# Patient Record
Sex: Female | Born: 1960
Health system: Southern US, Community
[De-identification: ages and names within clinical notes are randomized; demographics above are authoritative.]

## PROBLEM LIST (undated history)

## (undated) DIAGNOSIS — Z91018 Allergy to other foods: Secondary | ICD-10-CM

## (undated) DIAGNOSIS — K219 Gastro-esophageal reflux disease without esophagitis: Secondary | ICD-10-CM

## (undated) HISTORY — DX: Gastro-esophageal reflux disease without esophagitis: K21.9

## (undated) HISTORY — DX: Allergy to other foods: Z91.018

## (undated) HISTORY — PX: DILATION AND CURETTAGE OF UTERUS: SHX78

---

## 1999-05-28 ENCOUNTER — Other Ambulatory Visit: Admission: RE | Admit: 1999-05-28 | Discharge: 1999-05-28 | Payer: Self-pay | Admitting: *Deleted

## 1999-06-07 ENCOUNTER — Ambulatory Visit (HOSPITAL_COMMUNITY): Admission: RE | Admit: 1999-06-07 | Discharge: 1999-06-07 | Payer: Self-pay | Admitting: *Deleted

## 2000-04-29 ENCOUNTER — Other Ambulatory Visit: Admission: RE | Admit: 2000-04-29 | Discharge: 2000-04-29 | Payer: Self-pay | Admitting: *Deleted

## 2000-05-03 ENCOUNTER — Ambulatory Visit (HOSPITAL_COMMUNITY): Admission: RE | Admit: 2000-05-03 | Discharge: 2000-05-03 | Payer: Self-pay | Admitting: Obstetrics & Gynecology

## 2000-05-03 ENCOUNTER — Encounter: Payer: Self-pay | Admitting: Obstetrics & Gynecology

## 2000-05-05 ENCOUNTER — Ambulatory Visit: Admission: AD | Admit: 2000-05-05 | Discharge: 2000-05-05 | Payer: Self-pay | Admitting: Obstetrics and Gynecology

## 2000-05-05 ENCOUNTER — Encounter (INDEPENDENT_AMBULATORY_CARE_PROVIDER_SITE_OTHER): Payer: Self-pay | Admitting: Specialist

## 2000-12-05 ENCOUNTER — Encounter: Payer: Self-pay | Admitting: Internal Medicine

## 2000-12-05 ENCOUNTER — Ambulatory Visit (HOSPITAL_COMMUNITY): Admission: RE | Admit: 2000-12-05 | Discharge: 2000-12-05 | Payer: Self-pay | Admitting: Internal Medicine

## 2000-12-26 ENCOUNTER — Encounter: Payer: Self-pay | Admitting: Obstetrics and Gynecology

## 2000-12-26 ENCOUNTER — Encounter: Admission: RE | Admit: 2000-12-26 | Discharge: 2000-12-26 | Payer: Self-pay | Admitting: Obstetrics and Gynecology

## 2001-02-25 ENCOUNTER — Encounter: Admission: RE | Admit: 2001-02-25 | Discharge: 2001-02-25 | Payer: Self-pay | Admitting: Obstetrics and Gynecology

## 2001-02-25 ENCOUNTER — Encounter: Payer: Self-pay | Admitting: Obstetrics and Gynecology

## 2001-04-01 ENCOUNTER — Other Ambulatory Visit: Admission: RE | Admit: 2001-04-01 | Discharge: 2001-04-01 | Payer: Self-pay | Admitting: Obstetrics and Gynecology

## 2001-05-13 ENCOUNTER — Encounter (INDEPENDENT_AMBULATORY_CARE_PROVIDER_SITE_OTHER): Payer: Self-pay | Admitting: Specialist

## 2001-05-13 ENCOUNTER — Ambulatory Visit (HOSPITAL_COMMUNITY): Admission: RE | Admit: 2001-05-13 | Discharge: 2001-05-13 | Payer: Self-pay | Admitting: Obstetrics and Gynecology

## 2001-09-23 ENCOUNTER — Encounter: Payer: Self-pay | Admitting: Internal Medicine

## 2001-09-23 ENCOUNTER — Encounter: Admission: RE | Admit: 2001-09-23 | Discharge: 2001-09-23 | Payer: Self-pay | Admitting: Internal Medicine

## 2001-10-02 ENCOUNTER — Encounter: Payer: Self-pay | Admitting: Obstetrics and Gynecology

## 2001-10-02 ENCOUNTER — Ambulatory Visit (HOSPITAL_COMMUNITY): Admission: RE | Admit: 2001-10-02 | Discharge: 2001-10-02 | Payer: Self-pay | Admitting: Obstetrics and Gynecology

## 2002-05-26 ENCOUNTER — Other Ambulatory Visit: Admission: RE | Admit: 2002-05-26 | Discharge: 2002-05-26 | Payer: Self-pay | Admitting: Obstetrics and Gynecology

## 2002-05-28 ENCOUNTER — Encounter: Payer: Self-pay | Admitting: Obstetrics and Gynecology

## 2002-05-28 ENCOUNTER — Encounter: Admission: RE | Admit: 2002-05-28 | Discharge: 2002-05-28 | Payer: Self-pay | Admitting: Obstetrics and Gynecology

## 2003-05-30 ENCOUNTER — Other Ambulatory Visit: Admission: RE | Admit: 2003-05-30 | Discharge: 2003-05-30 | Payer: Self-pay | Admitting: Obstetrics and Gynecology

## 2003-06-20 ENCOUNTER — Encounter: Admission: RE | Admit: 2003-06-20 | Discharge: 2003-06-20 | Payer: Self-pay | Admitting: Neurology

## 2003-06-20 ENCOUNTER — Encounter: Payer: Self-pay | Admitting: Neurology

## 2004-05-29 ENCOUNTER — Encounter: Admission: RE | Admit: 2004-05-29 | Discharge: 2004-05-29 | Payer: Self-pay | Admitting: Obstetrics and Gynecology

## 2005-06-03 ENCOUNTER — Encounter: Admission: RE | Admit: 2005-06-03 | Discharge: 2005-06-03 | Payer: Self-pay | Admitting: Obstetrics and Gynecology

## 2006-03-12 ENCOUNTER — Encounter: Payer: Self-pay | Admitting: *Deleted

## 2007-01-06 ENCOUNTER — Encounter: Admission: RE | Admit: 2007-01-06 | Discharge: 2007-01-06 | Payer: Self-pay | Admitting: Internal Medicine

## 2007-06-08 ENCOUNTER — Encounter: Admission: RE | Admit: 2007-06-08 | Discharge: 2007-06-08 | Payer: Self-pay | Admitting: Obstetrics and Gynecology

## 2007-06-11 ENCOUNTER — Encounter: Admission: RE | Admit: 2007-06-11 | Discharge: 2007-06-11 | Payer: Self-pay | Admitting: Obstetrics and Gynecology

## 2008-06-16 ENCOUNTER — Encounter: Admission: RE | Admit: 2008-06-16 | Discharge: 2008-06-16 | Payer: Self-pay | Admitting: Obstetrics and Gynecology

## 2010-06-15 ENCOUNTER — Encounter: Admission: RE | Admit: 2010-06-15 | Discharge: 2010-06-15 | Payer: Self-pay | Admitting: Obstetrics and Gynecology

## 2010-12-02 ENCOUNTER — Encounter: Payer: Self-pay | Admitting: Obstetrics and Gynecology

## 2011-03-29 NOTE — Op Note (Signed)
[facility]  Patient:    Tammy Whitney, Tammy Whitney                       MRN: [id number] Proc. Date: 05/05/00 Attending:  Janine Limbo, M.D.                           Operative Report  PREOPERATIVE DIAGNOSIS:       Missed abortion.  POSTOPERATIVE DIAGNOSIS:      Missed abortion.  OPERATION:                    Suction dilatation and evacuation.  SURGEON:                      Janine Limbo, M.D.  ASSISTANT:  ANESTHESIA:                   IV sedation and paracervical block using 1% Xylocaine.  ESTIMATED BLOOD LOSS:  INDICATIONS:                  [name] is a 50 year old female who presents ith vaginal bleeding and slight cramping.  Her quantitative hCG on April 29, 2000, was 3694.  Repeat quantitative hCG on May 01, 2000, was 3300.  The patient understands the indications for her procedure.  She accepts the risks of, but not limited to, anesthetic complications, bleeding, infections, and possible damage to the surrounding organs.  FINDINGS:                     The patients blood type is O positive.  A small amount of products of conception were removed from within a 6 to 8 weeks size uterus.  No adnexal masses were appreciated.  DESCRIPTION OF PROCEDURE:     The patient was taken to the operating room where she was given medication through her IV line.  The perineum was prepped with multiple layers of Betadine.  The bladder was drained of urine.  Examination under anesthesia was performed.  The vagina was prepped with multiple layers of Betadine. A paracervical block was placed using 10 cc of 1% Xylocaine without epinephrine. The uterus sounded to approximately 10 cm.  The cervix was gradually dilated. he uterine cavity was evacuated using a #8 suction curet and then a medium sharp curet.  The cavity was felt to be clean at the end of our procedure. Hemostasis was adequate.  All instruments were removed.  Examination was repeated  and the uterus was noted to be firm.  The patient was then taken to the recovery room in stable condition.  FOLLOW-UP:                    The patient was given a copy of the postoperative  instruction sheet as prepared by [facility] for patients who  have undergone a dilatation and curettage.  She was given a prescription for Vicodin one to two p.o. q.4h. p.r.n. pain.  She will return to see Janine Limbo, M.D. in two to three weeks for follow-up examination.  She will call or questions or concerns. DD:  05/05/00 TD:  05/05/00 Job: [id number]

## 2011-03-29 NOTE — Op Note (Signed)
[facility]  Patient:    Tammy Whitney, Tammy Whitney                       MRN: [id number] Proc. Date: 05/13/01 Adm. Date:  [id number] Attending:  Dierdre Forth Pearline                           Operative Report  PREOPERATIVE DIAGNOSIS:       Missed abortion, intrauterine fetal demise at                               11 weeks.  POSTOPERATIVE DIAGNOSIS:      Missed abortion, intrauterine fetal demise at                               11 weeks.  OPERATION:                    Suction dilatation and evacuation.  SURGEON:                      Vanessa P. Pennie Rushing, M.D.  ANESTHESIA:                   Monitored anesthesia care and local.  ESTIMATED BLOOD LOSS:         Less than 50 cc.  COMPLICATIONS:                None.  FINDINGS:                     The uterus was enlarged to approximately 10-week size. There was a moderate amount of products of conception in the uterine cavity.  DESCRIPTION OF PROCEDURE:     The patient was taken to the operating room after appropriate identification and placed on the operating table. After attaining equipment for monitored anesthesia care, she was placed in the lithotomy position. The perineum and vagina were prepped with multiple layers of Betadine and draped as a sterile field. A red Robinson catheter was used to empty the bladder. A Graves speculum was placed in the vagina and a single-tooth tenaculum placed on the anterior cervix. A paracervical block was achieved with a total of 10 cc of 2% Xylocaine in the 5 and 7 oclock positions. The cervix was then dilated to accommodate a #10 suction catheter and this was used too suction evacuate all contents from the uterus. A sharp curet was used to insure that all products of conception had been removed. Hemostasis was noted to be adequate. All instruments were removed from the vagina. Silver nitrate was used to cauterize the area where the single-tooth tenaculum had been. The  patient was taken from the operating room to the recovery room in satisfactory condition having tolerated the procedure well with sponge and instrument counts correct. DD:  05/13/01 TD:  05/13/01 Job: [id number]
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
## 2011-05-22 ENCOUNTER — Other Ambulatory Visit: Payer: Self-pay | Admitting: Obstetrics and Gynecology

## 2011-05-22 DIAGNOSIS — Z1231 Encounter for screening mammogram for malignant neoplasm of breast: Secondary | ICD-10-CM

## 2011-06-18 ENCOUNTER — Ambulatory Visit
Admission: RE | Admit: 2011-06-18 | Discharge: 2011-06-18 | Disposition: A | Discharge status: Home / Self Care | Payer: BC Managed Care – PPO | Source: Ambulatory Visit | Attending: Obstetrics and Gynecology | Admitting: Obstetrics and Gynecology

## 2011-06-18 DIAGNOSIS — Z1231 Encounter for screening mammogram for malignant neoplasm of breast: Secondary | ICD-10-CM

## 2012-01-01 DIAGNOSIS — R490 Dysphonia: Secondary | ICD-10-CM | POA: Insufficient documentation

## 2012-05-22 ENCOUNTER — Other Ambulatory Visit: Payer: Self-pay | Admitting: Obstetrics and Gynecology

## 2012-05-22 DIAGNOSIS — Z1231 Encounter for screening mammogram for malignant neoplasm of breast: Secondary | ICD-10-CM

## 2012-06-19 ENCOUNTER — Ambulatory Visit
Admission: RE | Admit: 2012-06-19 | Discharge: 2012-06-19 | Disposition: A | Discharge status: Home / Self Care | Payer: BC Managed Care – PPO | Source: Ambulatory Visit | Attending: Obstetrics and Gynecology | Admitting: Obstetrics and Gynecology

## 2012-06-19 DIAGNOSIS — Z1231 Encounter for screening mammogram for malignant neoplasm of breast: Secondary | ICD-10-CM

## 2012-09-10 ENCOUNTER — Other Ambulatory Visit: Payer: Self-pay | Admitting: Internal Medicine

## 2012-09-10 DIAGNOSIS — G43909 Migraine, unspecified, not intractable, without status migrainosus: Secondary | ICD-10-CM

## 2012-09-23 ENCOUNTER — Other Ambulatory Visit: Payer: BC Managed Care – PPO

## 2014-05-16 ENCOUNTER — Other Ambulatory Visit: Payer: Self-pay

## 2014-05-16 DIAGNOSIS — Z1231 Encounter for screening mammogram for malignant neoplasm of breast: Secondary | ICD-10-CM

## 2014-05-24 ENCOUNTER — Ambulatory Visit
Admission: RE | Admit: 2014-05-24 | Discharge: 2014-05-24 | Disposition: A | Discharge status: Home / Self Care | Payer: BC Managed Care – PPO | Source: Ambulatory Visit

## 2014-05-24 DIAGNOSIS — Z1231 Encounter for screening mammogram for malignant neoplasm of breast: Secondary | ICD-10-CM

## 2017-09-15 ENCOUNTER — Emergency Department (HOSPITAL_COMMUNITY)
Admission: EM | Admit: 2017-09-15 | Discharge: 2017-09-15 | Disposition: A | Discharge status: Home / Self Care | Payer: BLUE CROSS/BLUE SHIELD | Attending: Emergency Medicine | Admitting: Emergency Medicine

## 2017-09-15 ENCOUNTER — Encounter (HOSPITAL_COMMUNITY): Payer: Self-pay

## 2017-09-15 ENCOUNTER — Other Ambulatory Visit: Payer: Self-pay

## 2017-09-15 ENCOUNTER — Emergency Department (HOSPITAL_COMMUNITY): Payer: BLUE CROSS/BLUE SHIELD

## 2017-09-15 DIAGNOSIS — R0789 Other chest pain: Secondary | ICD-10-CM | POA: Insufficient documentation

## 2017-09-15 DIAGNOSIS — R079 Chest pain, unspecified: Secondary | ICD-10-CM

## 2017-09-15 LAB — BASIC METABOLIC PANEL
Anion gap: 8 (ref 5–15)
BUN: 10 mg/dL (ref 6–20)
CALCIUM: 9.3 mg/dL (ref 8.9–10.3)
CO2: 25 mmol/L (ref 22–32)
CREATININE: 0.71 mg/dL (ref 0.44–1.00)
Chloride: 108 mmol/L (ref 101–111)
GFR calc Af Amer: >60 mL/min (ref >60)
GFR calc non Af Amer: >60 mL/min (ref >60)
GLUCOSE: 106 mg/dL — AB (ref 65–99)
Potassium: 3.4 mmol/L — ABNORMAL LOW (ref 3.5–5.1)
Sodium: 141 mmol/L (ref 135–145)

## 2017-09-15 LAB — CBC
HCT: 38.1 % (ref 36.0–46.0)
Hemoglobin: 11.8 g/dL — ABNORMAL LOW (ref 12.0–15.0)
MCH: 26.2 pg (ref 26.0–34.0)
MCHC: 31 g/dL (ref 30.0–36.0)
MCV: 84.7 fL (ref 78.0–100.0)
PLATELETS: 162 10*3/uL (ref 150–400)
RBC: 4.5 MIL/uL (ref 3.87–5.11)
RDW: 13.4 % (ref 11.5–15.5)
WBC: 4.6 10*3/uL (ref 4.0–10.5)

## 2017-09-15 LAB — I-STAT TROPONIN, ED
TROPONIN I, POC: 0.01 ng/mL (ref 0.00–0.08)
Troponin i, poc: 0.04 ng/mL (ref 0.00–0.08)

## 2017-09-15 MED ORDER — IBUPROFEN 400 MG PO TABS
ORAL_TABLET | ORAL | Status: AC
  Filled 2017-09-15: qty 1

## 2017-09-15 MED ORDER — LIDOCAINE 5 % EX PTCH: 1.0000 | MEDICATED_PATCH | CUTANEOUS | 0 refills | Status: DC

## 2017-09-15 MED ORDER — LIDOCAINE 5 % EX PTCH
1.0000 | MEDICATED_PATCH | CUTANEOUS | Status: DC
  Administered 2017-09-15: 1 via TRANSDERMAL
  Filled 2017-09-15: qty 1

## 2017-09-15 MED ORDER — IBUPROFEN 400 MG PO TABS
400.0000 mg | ORAL_TABLET | Freq: Once | ORAL | Status: AC | PRN
Start: 2017-09-15 — End: 2017-09-15
  Administered 2017-09-15: 400 mg via ORAL

## 2017-09-15 NOTE — ED Triage Notes (Signed)
Pt reports left sided chest pain since yesterday morning that began when she was in the bed. Pain radiates down left arm. Denies n/v, sob, diaphoresis.

## 2017-09-15 NOTE — ED Provider Notes (Signed)
[facility] EMERGENCY DEPARTMENT Provider Note   CSN: [id number] Arrival date & time: 09/15/17  1500     History   Chief Complaint Chief Complaint  Patient presents with  . Chest Pain    HPI Tammy Whitney is a 56 y.o. female.  56 year old female with a history of borderline dyslipidemia presents to the emergency department for evaluation of chest pain.  She states that she noticed chest pain yesterday morning when she woke up.  She states that pain is intermittent and will last a few minutes before resolving.  She describes the pain as a sharp pressure which will also radiate down her left arm to her hand.  She has taken ibuprofen for symptoms as well as Pepto-Bismol without relief.  She was belching more than normal initially, though this has subsided.  She has had some improvement in her pain with movement; "may be because I am not thinking about it".  She has not had any associated fever, nausea, vomiting, shortness of breath, diaphoresis.  No leg swelling, recent surgeries or hospitalizations, prolonged travel.  She is RHD and denies recent heavy lifting, repetitive movements, or trauma. No personal history of diabetes, hypertension, ACS.  No family history of ACS      History reviewed. No pertinent past medical history.  There are no active problems to display for this patient.   Past Surgical History:  Procedure Laterality Date  . DILATION AND CURETTAGE OF UTERUS      OB History    No data available       Home Medications    Prior to Admission medications   Medication Sig Start Date End Date Taking? Authorizing Provider  lidocaine (LIDODERM) 5 % Place 1 patch daily onto the skin. Remove & Discard patch within 12 hours or as directed by MD 09/15/17   Antonietta Breach, PA-C    Family History No family history on file.  Social History Social History   Tobacco Use  . Smoking status: Never Smoker  . Smokeless tobacco: Never Used  Substance Use  Topics  . Alcohol use: No    Frequency: Never  . Drug use: No     Allergies   Fish allergy   Review of Systems Review of Systems Ten systems reviewed and are negative for acute change, except as noted in the HPI.    Physical Exam Updated Vital Signs BP 117/67   Pulse 66   Temp 98 F (36.7 C) (Oral)   Resp 18   Ht 5\' 7"  (1.702 m)   Wt 78 kg (172 lb)   SpO2 100%   BMI 26.94 kg/m   Physical Exam  Constitutional: She is oriented to person, place, and time. She appears well-developed and well-nourished. No distress.  Nontoxic appearing and in NAD  HENT:  Head: Normocephalic and atraumatic.  Eyes: Conjunctivae and EOM are normal. No scleral icterus.  Neck: Normal range of motion.  No JVD. No meningismus.  Cardiovascular: Normal rate, regular rhythm and intact distal pulses.  Pulmonary/Chest: Effort normal. No stridor. No respiratory distress. She has no wheezes. She has no rales.  Lungs CTAB. Respirations even and unlabored  Musculoskeletal: Normal range of motion.  No BLE edema  Neurological: She is alert and oriented to person, place, and time. She exhibits normal muscle tone. Coordination normal.  GCS 15. Patient moving all extremities.  Skin: Skin is warm and dry. No rash noted. She is not diaphoretic. No erythema. No pallor.  Psychiatric: She has  a normal mood and affect. Her behavior is normal.  Nursing note and vitals reviewed.    ED Treatments / Results  Labs (all labs ordered are listed, but only abnormal results are displayed) Labs Reviewed  BASIC METABOLIC PANEL - Abnormal; Notable for the following components:      Result Value   Potassium 3.4 (*)    Glucose, Bld 106 (*)    All other components within normal limits  CBC - Abnormal; Notable for the following components:   Hemoglobin 11.8 (*)    All other components within normal limits  I-STAT TROPONIN, ED  I-STAT TROPONIN, ED    EKG  EKG Interpretation  Date/Time:  Monday September 15 2017  15:05:42 EST Ventricular Rate:  89 PR Interval:  156 QRS Duration: 80 QT Interval:  348 QTC Calculation: 423 R Axis:   68 Text Interpretation:  Normal sinus rhythm Normal ECG No previous tracing Confirmed by Blanchie Dessert [phone]) on 09/15/2017 9:01:05 PM       Radiology Dg Chest 2 View  Result Date: 09/15/2017 CLINICAL DATA:  Left upper chest pain EXAM: CHEST  2 VIEW COMPARISON:  01/06/2007 FINDINGS: The heart size and mediastinal contours are within normal limits. Both lungs are clear. The visualized skeletal structures are unremarkable. IMPRESSION: No active cardiopulmonary disease. Electronically Signed   By: Kerby Moors M.D.   On: 09/15/2017 15:50    Procedures Procedures (including critical care time)  Medications Ordered in ED Medications  ibuprofen (ADVIL,MOTRIN) 400 MG tablet (not administered)  lidocaine (LIDODERM) 5 % 1 patch (1 patch Transdermal Patch Applied 09/15/17 2203)  ibuprofen (ADVIL,MOTRIN) tablet 400 mg (400 mg Oral Given 09/15/17 1835)     Initial Impression / Assessment and Plan / ED Course  I have reviewed the triage vital signs and the nursing notes.  Pertinent labs & imaging results that were available during my care of the patient were reviewed by me and considered in my medical decision making (see chart for details).     56 year old female presents to the emergency department for evaluation of chest pain.  He reports intermittent left-sided chest pain which began yesterday and radiates to her LUE.  No associated shortness of breath, nausea, vomiting, diaphoresis.  No known trauma or injury inciting onset of pain.  Cardiac workup today is reassuring.  Troponin negative x 2 and EKG without signs of acute ischemia.  Chest x-ray with no infiltrate, pneumothorax, pneumonia.  No mediastinal widening to suggest dissection. Patient with no tachycardia, tachypnea, hypoxia. No recent surgeries, hospitalizations, travel, leg swelling. Doubt PE.  Low  suspicion for emergent cause of chest pain today.  She has had some improvement with a Lidoderm patch applied in ED. Will refer to PCP for outpatient follow-up should symptoms persist.  Return precautions discussed and provided.  Patient discharged in stable condition with no unaddressed concerns.   Final Clinical Impressions(s) / ED Diagnoses   Final diagnoses:  Nonspecific chest pain    ED Discharge Orders        Ordered    lidocaine (LIDODERM) 5 %  Every 24 hours     09/15/17 2252       Antonietta Breach, PA-C 09/15/17 2352    Blanchie Dessert, MD 09/16/17 2322

## 2017-09-15 NOTE — Discharge Instructions (Signed)
Your workup in the emergency department today was reassuring and did not show a concerning cause of your chest pain.  We advise that you continue follow-up with your primary care doctor.  You may use a topical Lidoderm patch as needed for persistent pain.  We also recommend consistent use of 800 mg ibuprofen every 8 hours over the next few days.  You may return for new or concerning symptoms.

## 2018-08-03 ENCOUNTER — Other Ambulatory Visit (INDEPENDENT_AMBULATORY_CARE_PROVIDER_SITE_OTHER): Payer: BLUE CROSS/BLUE SHIELD

## 2018-08-03 ENCOUNTER — Ambulatory Visit: Payer: BLUE CROSS/BLUE SHIELD | Admitting: Internal Medicine

## 2018-08-03 ENCOUNTER — Ambulatory Visit (INDEPENDENT_AMBULATORY_CARE_PROVIDER_SITE_OTHER)
Admission: RE | Admit: 2018-08-03 | Discharge: 2018-08-03 | Disposition: A | Discharge status: Home / Self Care | Payer: BLUE CROSS/BLUE SHIELD | Source: Ambulatory Visit | Attending: Internal Medicine | Admitting: Internal Medicine

## 2018-08-03 ENCOUNTER — Encounter: Payer: Self-pay | Admitting: Internal Medicine

## 2018-08-03 VITALS — BP 110/74 | HR 88 | Ht 67.25 in | Wt 173.4 lb

## 2018-08-03 DIAGNOSIS — R0600 Dyspnea, unspecified: Secondary | ICD-10-CM | POA: Diagnosis not present

## 2018-08-03 DIAGNOSIS — R0689 Other abnormalities of breathing: Secondary | ICD-10-CM

## 2018-08-03 DIAGNOSIS — M5090 Cervical disc disorder, unspecified, unspecified cervical region: Secondary | ICD-10-CM

## 2018-08-03 LAB — CBC WITH DIFFERENTIAL/PLATELET
BASOS ABS: 0 10*3/uL (ref 0.0–0.1)
Basophils Relative: 0.8 % (ref 0.0–3.0)
EOS PCT: 3 % (ref 0.0–5.0)
Eosinophils Absolute: 0.1 10*3/uL (ref 0.0–0.7)
HEMATOCRIT: 39.1 % (ref 36.0–46.0)
Hemoglobin: 12.7 g/dL (ref 12.0–15.0)
LYMPHS ABS: 0.9 10*3/uL (ref 0.7–4.0)
LYMPHS PCT: 24.5 % (ref 12.0–46.0)
MCHC: 32.5 g/dL (ref 30.0–36.0)
MCV: 81.9 fl (ref 78.0–100.0)
MONOS PCT: 10 % (ref 3.0–12.0)
Monocytes Absolute: 0.4 10*3/uL (ref 0.1–1.0)
NEUTROS ABS: 2.4 10*3/uL (ref 1.4–7.7)
NEUTROS PCT: 61.7 % (ref 43.0–77.0)
PLATELETS: 158 10*3/uL (ref 150.0–400.0)
RBC: 4.78 Mil/uL (ref 3.87–5.11)
RDW: 13.1 % (ref 11.5–15.5)
WBC: 3.8 10*3/uL — ABNORMAL LOW (ref 4.0–10.5)

## 2018-08-03 MED ORDER — UMECLIDINIUM-VILANTEROL 62.5-25 MCG/INH IN AEPB: 1.0000 | INHALATION_SPRAY | Freq: Every day | RESPIRATORY_TRACT | 0 refills | Status: AC

## 2018-08-03 NOTE — Patient Instructions (Signed)
Order- lab- CBC w diff     Dx dyspnea  Order- CXR     Dx dyspnea  Order- schedule PFT    Dx dyspnea  Sample Anoro inhaler       Inhale 1 puff, once daily till the sample is used up. See if it makes your breathing more comfortable
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
## 2018-08-04 ENCOUNTER — Telehealth: Payer: Self-pay | Admitting: Internal Medicine

## 2018-08-04 DIAGNOSIS — M5090 Cervical disc disorder, unspecified, unspecified cervical region: Secondary | ICD-10-CM | POA: Insufficient documentation

## 2018-08-04 DIAGNOSIS — R0600 Dyspnea, unspecified: Secondary | ICD-10-CM | POA: Insufficient documentation

## 2018-08-04 DIAGNOSIS — R0689 Other abnormalities of breathing: Secondary | ICD-10-CM | POA: Insufficient documentation

## 2018-08-04 NOTE — Telephone Encounter (Signed)
Notes recorded by Deneise Lever, MD on 08/03/2018 at 1:25 PM EDT Hemoglobin is now in normal range at 12.7 grams, and should not be a reason for shortness of breath.  Notes recorded by Deneise Lever, MD on 08/04/2018 at 9:34 AM EDT CXR- normal , lungs are clear  lmtcb x1 for pt
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
## 2018-08-04 NOTE — Assessment & Plan Note (Signed)
Working with chiropractor

## 2018-08-05 NOTE — Telephone Encounter (Signed)
Attempted to call pt. I did not receive an answer. I have left a message for pt to return our call.  

## 2018-08-06 NOTE — Telephone Encounter (Signed)
lmtcb x3 for pt. 

## 2018-08-07 NOTE — Telephone Encounter (Signed)
Called patient and made her aware of these result and she verbalized understanding  Nothing further need at this time.

## 2018-10-05 ENCOUNTER — Ambulatory Visit: Payer: BLUE CROSS/BLUE SHIELD | Admitting: Internal Medicine

## 2018-10-29 ENCOUNTER — Ambulatory Visit: Payer: BLUE CROSS/BLUE SHIELD | Admitting: Pulmonary Disease

## 2019-01-12 IMAGING — DX DG CHEST 2V
2 series · 2 of 2 positions shown · non-contrast
Comparison: 01/06/2007

CLINICAL DATA: Left upper chest pain

EXAM:
CHEST  2 VIEW

[chest pa]
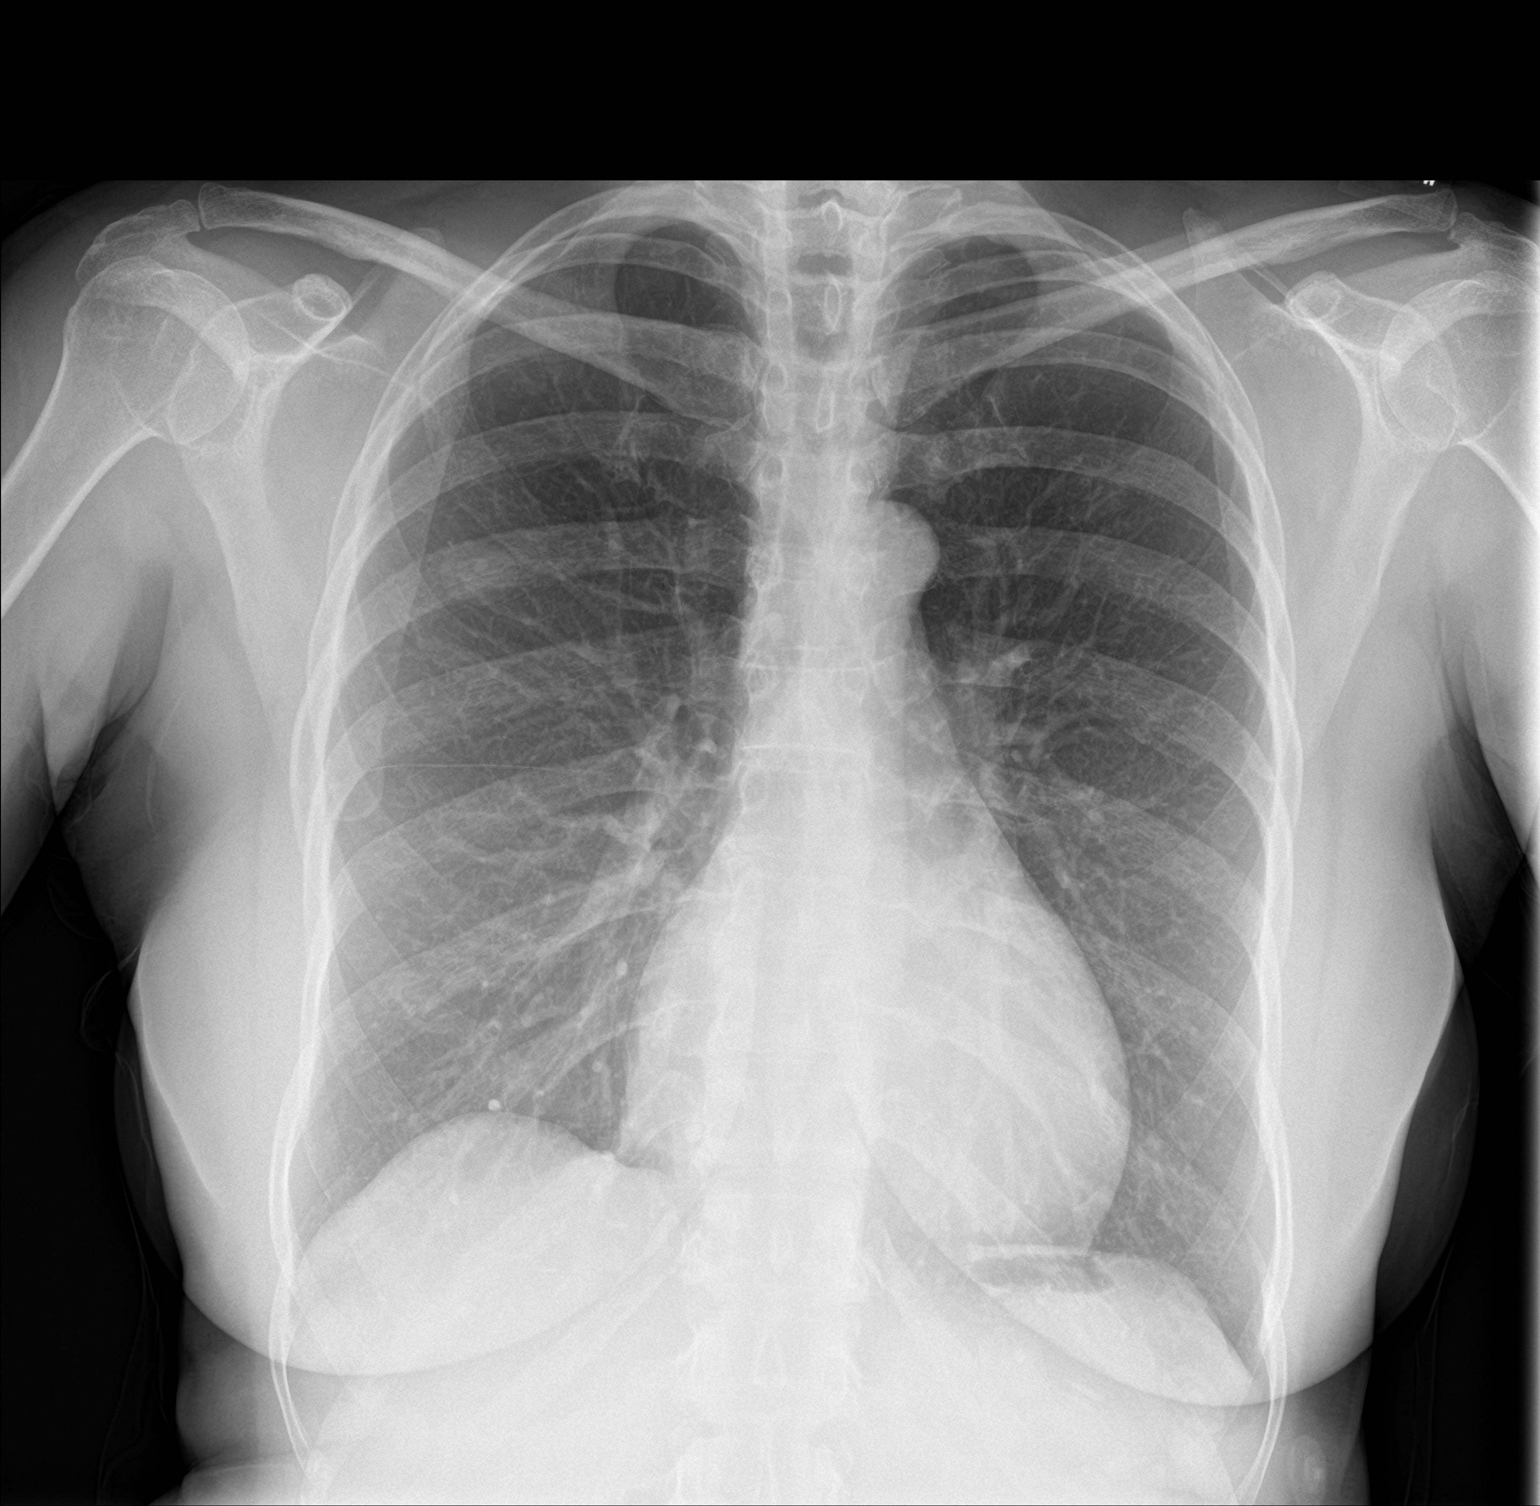

[chest lat]
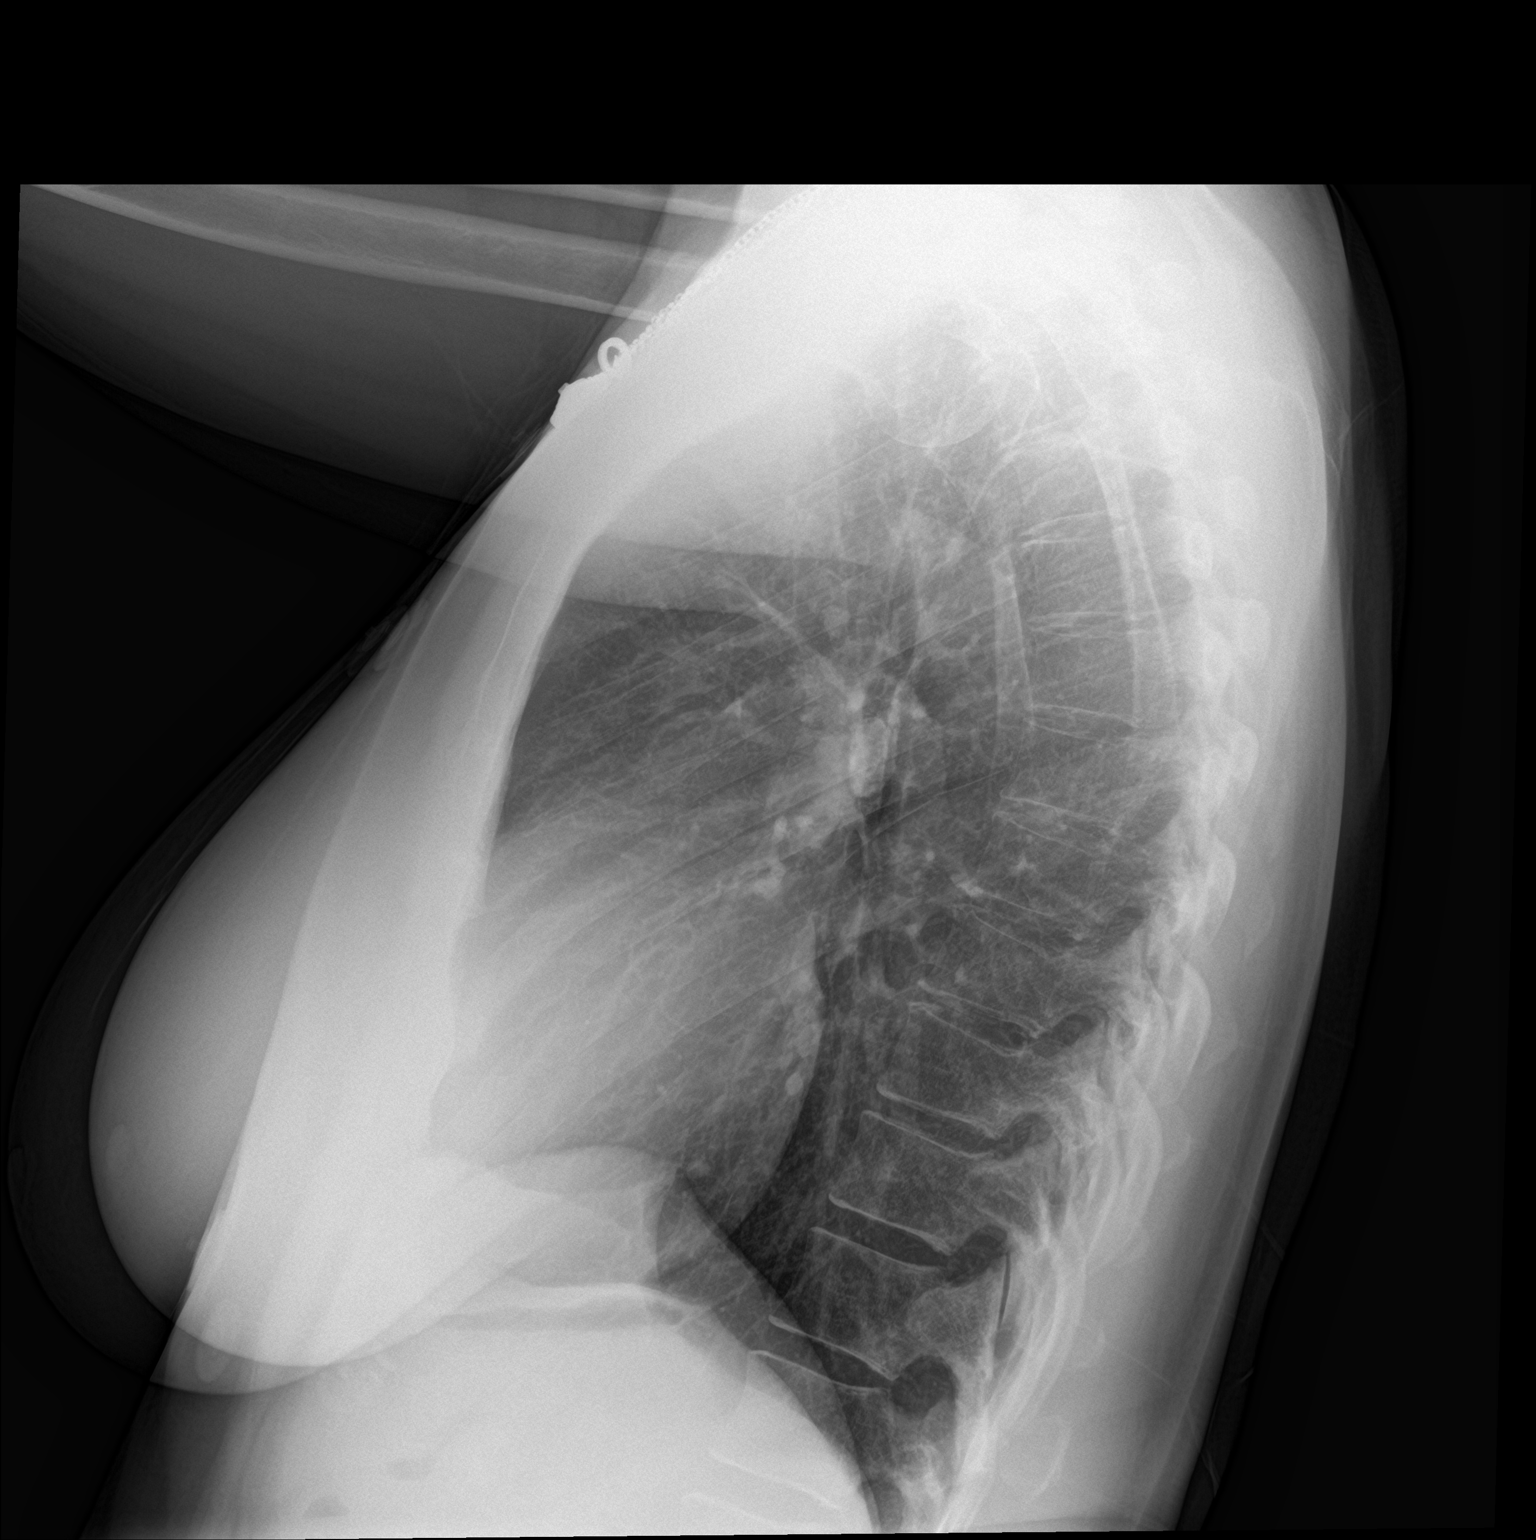

[2 of 2 positions shown; findings below may reference images not displayed]

FINDINGS: The heart size and mediastinal contours are within normal limits.
Both lungs are clear. The visualized skeletal structures are
unremarkable.
IMPRESSION: No active cardiopulmonary disease.

## 2019-05-11 ENCOUNTER — Other Ambulatory Visit: Payer: Self-pay | Admitting: *Deleted

## 2019-05-11 DIAGNOSIS — Z20822 Contact with and (suspected) exposure to covid-19: Secondary | ICD-10-CM

## 2019-05-18 LAB — SPECIMEN STATUS REPORT

## 2019-05-18 LAB — NOVEL CORONAVIRUS, NAA: SARS-CoV-2, NAA: NOT DETECTED

## 2019-06-01 ENCOUNTER — Telehealth: Payer: Self-pay | Admitting: Family Medicine

## 2019-06-01 NOTE — Telephone Encounter (Signed)
Patient called and received results

## 2019-11-30 IMAGING — DX DG CHEST 2V
2 series · 2 of 2 positions shown · non-contrast
Comparison: 09/15/2017

CLINICAL DATA: Shortness of breath, dyspnea

EXAM:
CHEST - 2 VIEW

[chest pa]
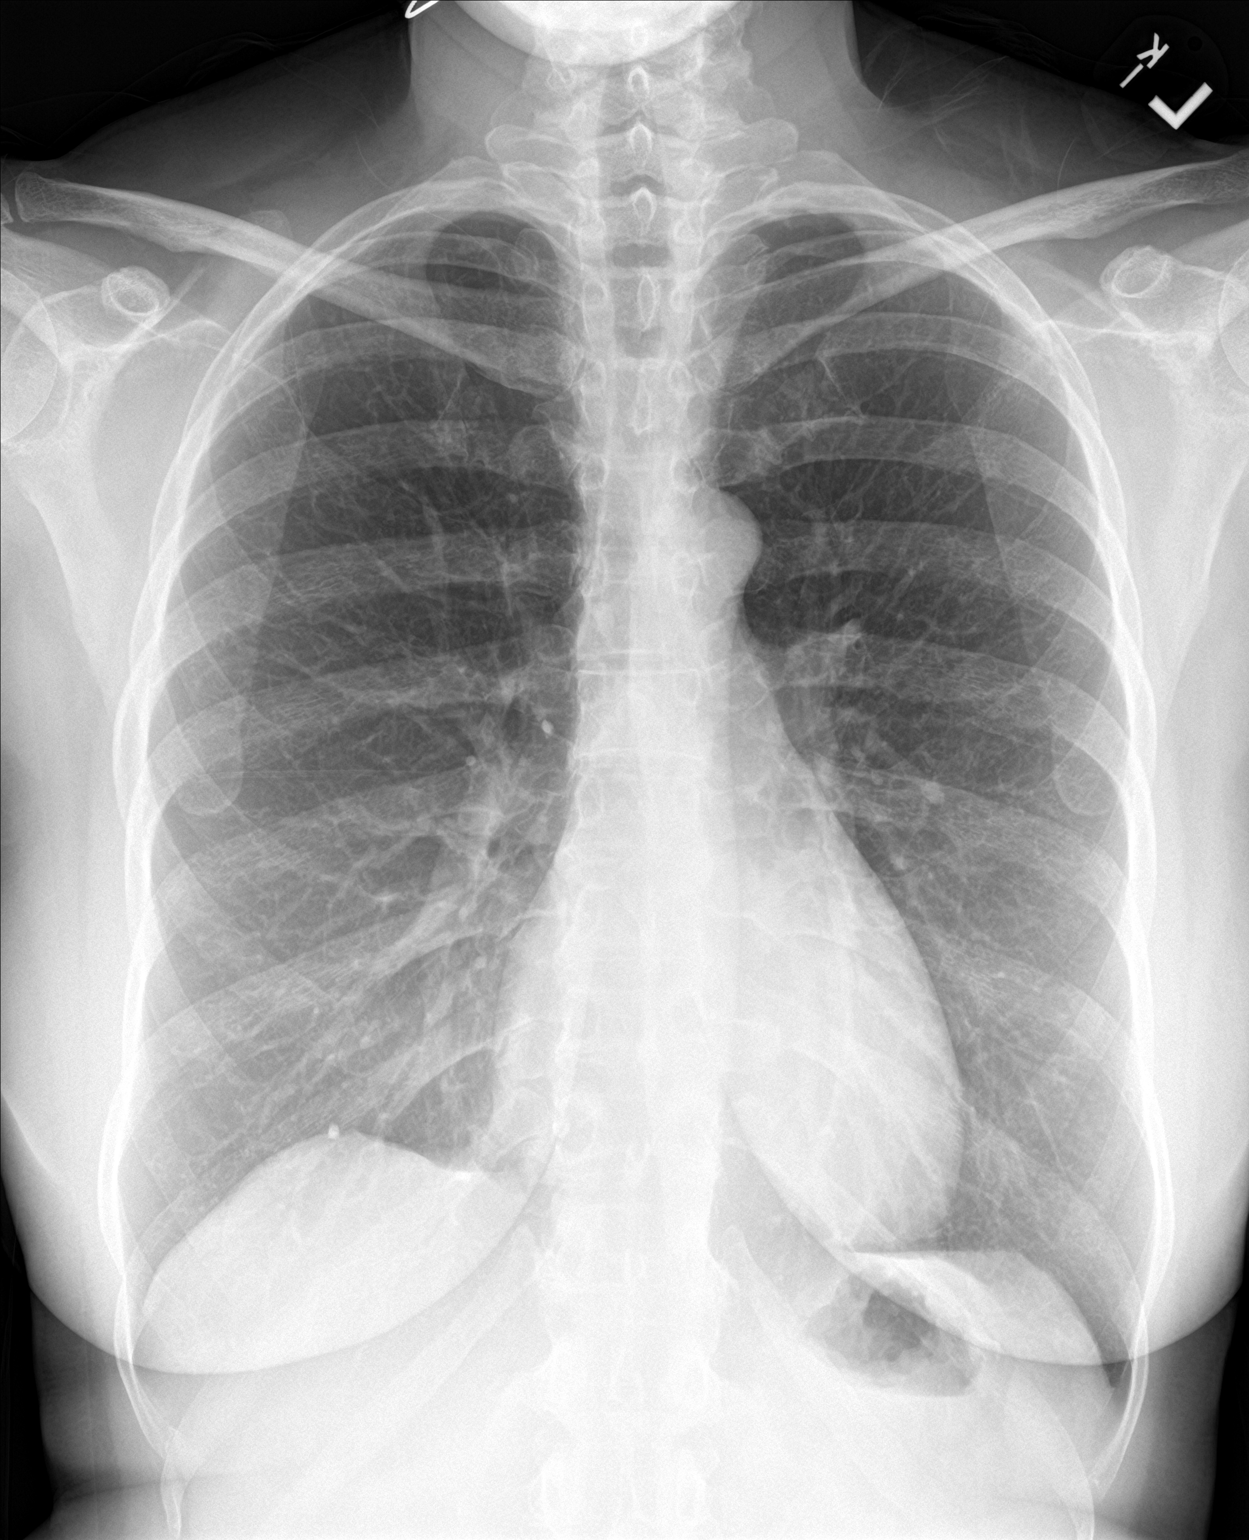

[chest lat]
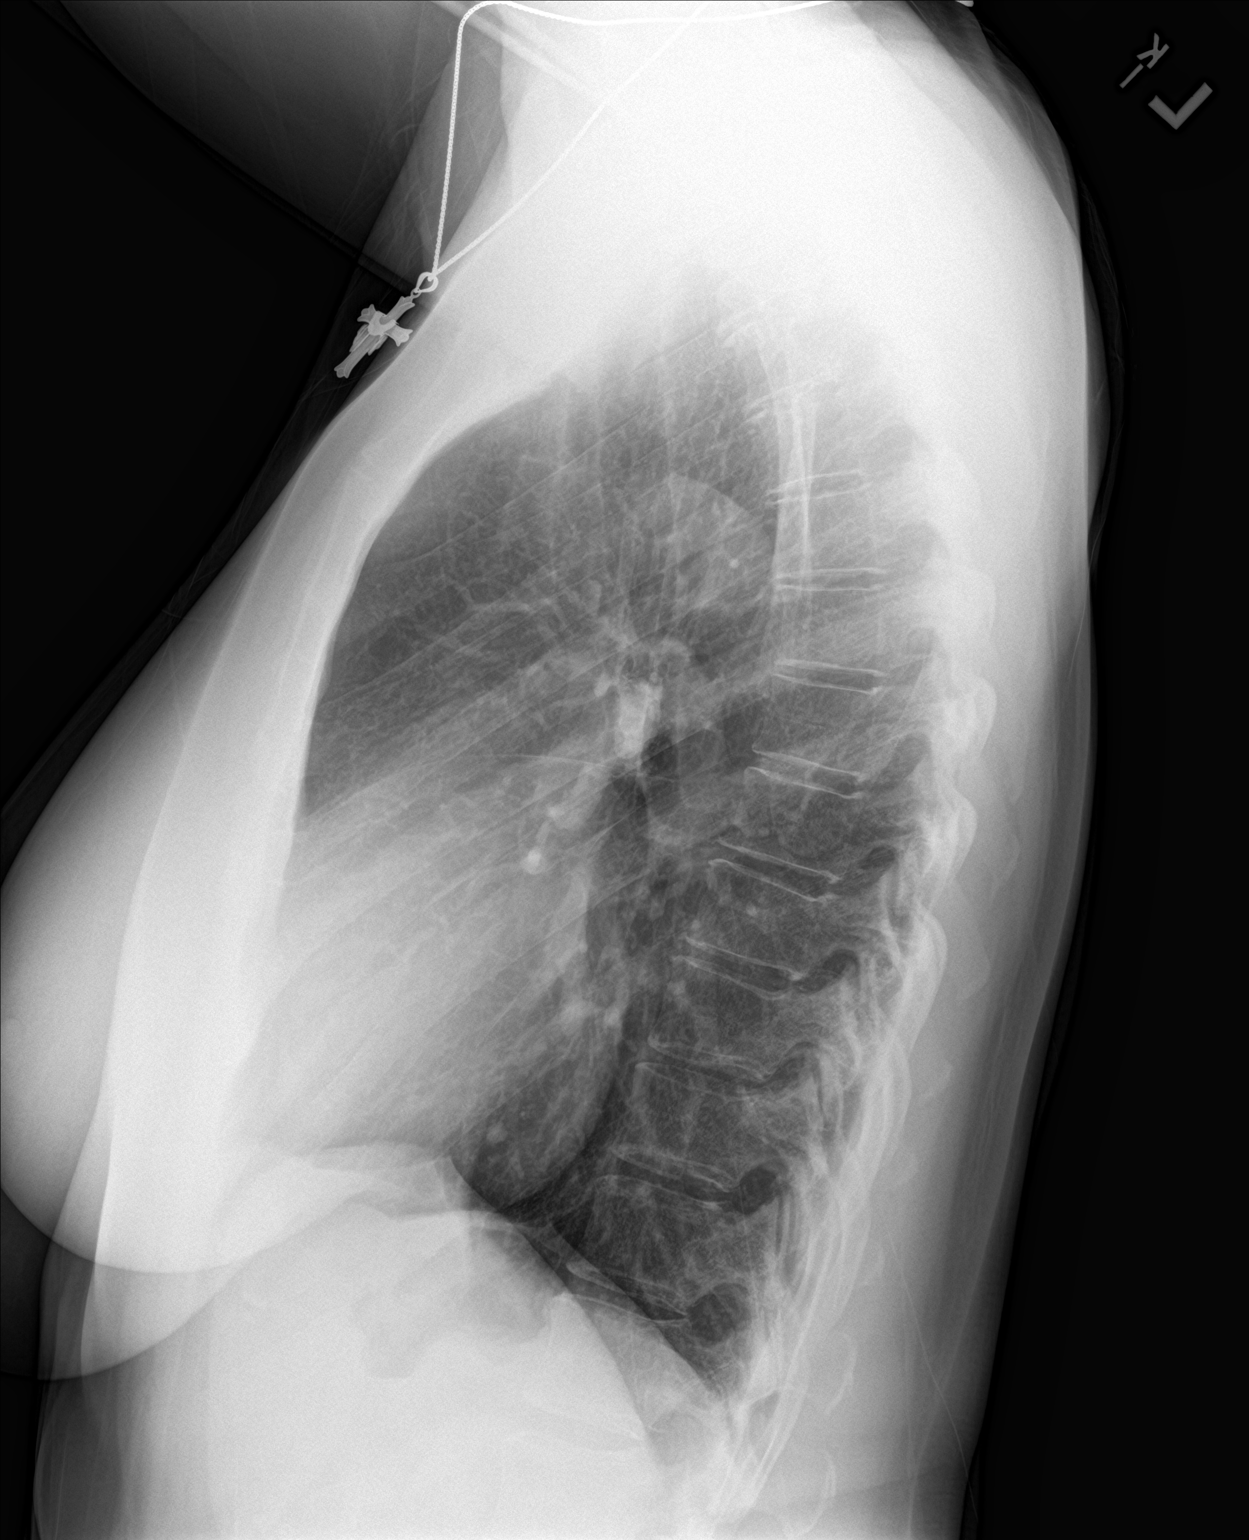

[2 of 2 positions shown; findings below may reference images not displayed]

FINDINGS: Normal heart size, mediastinal contours, and pulmonary vascularity.

Lungs clear.

No pleural effusion or pneumothorax.

Bones unremarkable.
IMPRESSION: Normal exam.

## 2020-01-08 ENCOUNTER — Ambulatory Visit: Payer: Self-pay | Attending: Internal Medicine

## 2020-01-08 DIAGNOSIS — Z23 Encounter for immunization: Secondary | ICD-10-CM | POA: Insufficient documentation

## 2020-01-08 NOTE — Progress Notes (Signed)
   Covid-19 Vaccination Clinic  Name:  Tammy Whitney    MRN: [id number] DOB: 1960/12/04  01/08/2020  [name] was observed post Covid-19 immunization for 15 minutes without incidence. She was provided with Vaccine Information Sheet and instruction to access the V-Safe system.   [name] was instructed to call [phone] with any severe reactions post vaccine: Marland Kitchen Difficulty breathing  . Swelling of your face and throat  . A fast heartbeat  . A bad rash all over your body  . Dizziness and weakness    Immunizations Administered    Name Date Dose VIS Date Route   Pfizer COVID-19 Vaccine 01/08/2020 11:23 AM 0.3 mL 10/22/2019 Intramuscular   Manufacturer: [place]   Lot: [id number]   [place]: [id number]

## 2020-02-02 ENCOUNTER — Ambulatory Visit: Payer: Self-pay | Attending: Internal Medicine

## 2020-02-02 DIAGNOSIS — Z23 Encounter for immunization: Secondary | ICD-10-CM

## 2020-02-02 NOTE — Progress Notes (Signed)
   Covid-19 Vaccination Clinic  Name:  Tammy Whitney    MRN: [id number] DOB: [birth year]  02/02/2020  [name] was observed post Covid-19 immunization for 15 minutes without incident. She was provided with Vaccine Information Sheet and instruction to access the V-Safe system.   [name] was instructed to call 911 with any severe reactions post vaccine: Marland Kitchen Difficulty breathing  . Swelling of face and throat  . A fast heartbeat  . A bad rash all over body  . Dizziness and weakness   Immunizations Administered    Name Date Dose VIS Date Route   Pfizer COVID-19 Vaccine 02/02/2020 12:07 PM 0.3 mL 10/22/2019 Intramuscular   Manufacturer: [place]   Lot: [id number]   [place]: [id number]

## 2020-03-01 ENCOUNTER — Ambulatory Visit (INDEPENDENT_AMBULATORY_CARE_PROVIDER_SITE_OTHER): Payer: BC Managed Care – PPO | Admitting: Allergy

## 2020-03-01 ENCOUNTER — Other Ambulatory Visit: Payer: Self-pay

## 2020-03-01 ENCOUNTER — Encounter: Payer: Self-pay | Admitting: Allergy

## 2020-03-01 VITALS — BP 110/70 | HR 91 | Temp 97.2°F | Resp 16 | Ht 66.5 in | Wt 183.6 lb

## 2020-03-01 DIAGNOSIS — R0989 Other specified symptoms and signs involving the circulatory and respiratory systems: Secondary | ICD-10-CM

## 2020-03-01 DIAGNOSIS — J3089 Other allergic rhinitis: Secondary | ICD-10-CM | POA: Diagnosis not present

## 2020-03-01 DIAGNOSIS — J989 Respiratory disorder, unspecified: Secondary | ICD-10-CM

## 2020-03-01 DIAGNOSIS — T7800XA Anaphylactic reaction due to unspecified food, initial encounter: Secondary | ICD-10-CM

## 2020-03-01 DIAGNOSIS — H1013 Acute atopic conjunctivitis, bilateral: Secondary | ICD-10-CM | POA: Diagnosis not present

## 2020-03-01 MED ORDER — EPINEPHRINE 0.3 MG/0.3ML IJ SOAJ: 0.3000 mg | Freq: Once | INTRAMUSCULAR | 2 refills | Status: AC

## 2020-03-01 MED ORDER — ALBUTEROL SULFATE HFA 108 (90 BASE) MCG/ACT IN AERS: 2.0000 | INHALATION_SPRAY | Freq: Four times a day (QID) | RESPIRATORY_TRACT | 1 refills | Status: DC | PRN

## 2020-03-01 NOTE — Progress Notes (Signed)
New Patient Note  RE: Tammy Whitney MRN: [id number] DOB: 26-Jul-1961 Date of Office Visit: 03/01/2020  Referring provider: Chesley Noon, MD Primary care provider: Chesley Noon, MD  Chief Complaint: allergies  History of present illness: Tammy Whitney is a 59 y.o. female presenting today for consultation for allergic rhinitis and food allergy.   She states she has an allergy to fish with scales.  She states she has been allergic to fish as long as she can remember.  She states her parents would have fish and fish fries growing up and she recalls having symptoms including wheezing, cough and rashes.   She states they thought she had asthma due to this.   However she would have symptoms with fish exposure/ingestion.  She states her parents stopped cooking fish and she avoided fish and she stopped having the episodes of wheezing/cough and rashes.  Thus realized she didn't have asthma but food allergy.    In college she discovered she could eat tuna out of a can and shellfish without issue.   She states that her husband was able to bake fish in the oven without her having any issues until more recently.  She states she had a feeling around Jan/Feb 2021 where she was having difficulty breathing and like she couldn't breathe well and "constricted" with no wheezing and then she noticed that her husband had just cooked fish.  She states she left the house and felt better when she got outside.   She states if she touches the plate that her husband has had fish on she has had itchy eyes if she touches her face and unable to wear her contacts for several days.    She also reports pollen allergy.  She was recently at the beach and states the pollen was high.   Reports symptoms include "constricted" breathing, sneezing and "rawness" in throat.  She took zyrtec and couldn't tell a difference.    She did have mold issue in her home from the bathroom leak and this was remedied several years  ago.     Review of systems: Review of Systems  Constitutional: Negative.   HENT:       See HPI  Eyes: Negative.   Respiratory:       See HPI  Cardiovascular: Negative.   Gastrointestinal: Negative.   Musculoskeletal: Negative.   Skin: Negative.   Neurological: Negative.     All other systems negative unless noted above in HPI  Past medical history: Past Medical History:  Diagnosis Date  . Food allergy     Past surgical history: Past Surgical History:  Procedure Laterality Date  . DILATION AND CURETTAGE OF UTERUS      Family history:  Family History  Problem Relation Age of Onset  . Food Allergy Daughter   . Allergic rhinitis Daughter     Social history: She lives in a house with central cooling with carpeting in the family room.  No pets in the home.  No concern for roaches in the home.  There is concern for mold and mildew in the home however this has been remedied.  She works as a Astronomer.  Denies a smoking history  Medication List: Current Outpatient Medications  Medication Sig Dispense Refill  . albuterol (VENTOLIN HFA) 108 (90 Base) MCG/ACT inhaler Inhale 2 puffs into the lungs every 6 (six) hours as needed for wheezing or shortness of breath. 18 g 1  . EPINEPHrine (AUVI-Q) 0.3 mg/0.3 mL  IJ SOAJ injection Inject 0.3 mLs (0.3 mg total) into the muscle once for 1 dose. As directed for life-threatening allergic reactions 2 each 2  . INTRAROSA 6.5 MG INST INSERT 1 TABLET VAGINALLY QHS  12   No current facility-administered medications for this visit.    Known medication allergies: Allergies  Allergen Reactions  . Fish Allergy     Only scale fish     Physical examination: Blood pressure 110/70, pulse 91, temperature (!) 97.2 F (36.2 C), temperature source Temporal, resp. rate 16, height 5' 6.5" (1.689 m), weight 183 lb 9.6 oz (83.3 kg), SpO2 98 %.  General: Alert, interactive, in no acute distress. HEENT: PERRLA, TMs pearly gray,  turbinates non-edematous without discharge, post-pharynx non erythematous. Neck: Supple without lymphadenopathy. Lungs: Clear to auscultation without wheezing, rhonchi or rales. {no increased work of breathing. CV: Normal S1, S2 without murmurs. Abdomen: Nondistended, nontender. Skin: Warm and dry, without lesions or rashes. Extremities:  No clubbing, cyanosis or edema. Neuro:   Grossly intact.  Diagnositics/Labs:  Spirometry: FEV1: 2.82L 122%, FVC: 3.79L 129%, ratio consistent with nonobstructive pattern  Allergy testing: environmental allergy skin prick testing is positive to grass pollens, weed pollens, tree pollens, dust mites and mouse. Skin prick testing to fish and shellfish panel are positive to cat fish, Bass, Trout, tuna, salmon, flounder, codfish. Allergy testing results were read and interpreted by provider, documented by clinical staff.   Assessment and plan: Anaphylaxis due to food Reactive airway secondary to food allergen   - continue avoidance of fish  - skin testing today is positive to entirety of fish panel.  Shellfish panel is negative  - have access to self-injectable epinephrine (Epipen or AuviQ) 0.3mg  at all times  - have access to an albuterol inhaler for as needed use if fish exposure and having difficulty breathing, wheezing, cough or shortness of breath  - follow emergency action plan in case of allergic reaction  Allergic rhinitis with conjunctivitis  - environmental allergy skin testing today is positive to tree pollens, weed pollens grass pollens, dust mite and mouse  - allergen avoidance measures discussed/handouts provided  - for general allergy symptom control recommend use of long-acting antihistamine like Allegra 180mg  or Xyzal 5mg  daily as needed  - for itchy/watery eyes recommend use of over-the-counter Pataday or Pataday Xtra strength 1 drop each eye daily as needed  - if medication management is ineffective then consider course of allergy  immunotherapy (allergy shots)  Follow-up 6 months or sooner if needed  I appreciate the opportunity to take part in Tammy Whitney's care. Please do not hesitate to contact me with questions.  Sincerely,   Prudy Feeler, MD Allergy/Immunology [facility]

## 2020-03-01 NOTE — Patient Instructions (Addendum)
 -   continue avoidance of scaled fish  - skin testing today is positive to entirety of fish panel.  Shellfish panel is negative  - have access to self-injectable epinephrine (Epipen or AuviQ) 0.3mg  at all times  - have access to an albuterol inhaler for as needed use if fish exposure and having difficulty breathing, wheezing, cough or shortness of breath  - follow emergency action plan in case of allergic reaction   - environmental allergy skin testing today is positive to tree pollens, weed pollens grass pollens, dust mite and mouse  - allergen avoidance measures discussed/handouts provided  - for general allergy symptom control recommend use of long-acting antihistamine like Allegra 180mg  or Xyzal 5mg  daily as needed  - for itchy/watery eyes recommend use of over-the-counter Pataday or Pataday Xtra strength 1 drop each eye daily as needed  - if medication management is ineffective then consider course of allergy immunotherapy (allergy shots)  Follow-up 6 months or sooner if needed

## 2020-08-31 ENCOUNTER — Ambulatory Visit: Payer: BC Managed Care – PPO | Admitting: Allergy

## 2021-04-03 ENCOUNTER — Encounter: Payer: Self-pay | Admitting: Cardiology

## 2021-04-03 ENCOUNTER — Ambulatory Visit: Payer: 59 | Admitting: Cardiology

## 2021-04-03 ENCOUNTER — Other Ambulatory Visit: Payer: Self-pay

## 2021-04-03 VITALS — BP 100/67 | HR 71 | Temp 98.3°F | Resp 16 | Ht 66.5 in | Wt 165.2 lb

## 2021-04-03 DIAGNOSIS — M79602 Pain in left arm: Secondary | ICD-10-CM

## 2021-04-03 DIAGNOSIS — R072 Precordial pain: Secondary | ICD-10-CM | POA: Insufficient documentation

## 2021-04-03 NOTE — Progress Notes (Signed)
    Patient referred by Chesley Noon, MD for chest pain  Subjective:   Tammy Whitney, female    DOB: [birth year], 60 y.o.   MRN: [id number]  Chief Complaint  Patient presents with  . Chest Pain    HPI  60 y/o [place] American female with eft arm and neck pain  Patient is physically very active, exercises and rides bike regularly couple times a week. She has had episodes of left sided neck and arm pain. Pain occurs more while lying down. She does not have this pain anytime during physical activity.    Past Medical History:  Diagnosis Date  . Food allergy      Past Surgical History:  Procedure Laterality Date  . DILATION AND CURETTAGE OF UTERUS       Social History   Tobacco Use  Smoking Status Never Smoker  Smokeless Tobacco Never Used    Social History   Substance and Sexual Activity  Alcohol Use Yes   Comment: occ wine     Family History  Problem Relation Age of Onset  . Food Allergy Daughter   . Allergic rhinitis Daughter      Current Outpatient Medications on File Prior to Visit  Medication Sig Dispense Refill  . pantoprazole (PROTONIX) 40 MG tablet Take 1 tablet by mouth daily.     No current facility-administered medications on file prior to visit.    Cardiovascular and other pertinent studies:  EKG 04/03/2021: Sinus rhythm 74 bpm  Normal EKG  Recent labs: 03/26/2021: Glucose 84, BUN/Cr 15/0.88. EGFR 75. Na/K 142/4.4. Rest of the CMP normal H/H 13/40. MCV 82. Platelets 167 HbA1C N/A Lipid panel N/A TSH N/A   Review of Systems  Cardiovascular: Negative for chest pain, dyspnea on exertion, leg swelling, palpitations and syncope.  Musculoskeletal: Positive for neck pain.       Left arm pain        Vitals:   04/03/21 0954  BP: 100/67  Pulse: 71  Resp: 16  Temp: 98.3 F (36.8 C)  SpO2: 98%     Body mass index is 26.26 kg/m. Filed Weights   04/03/21 0954  Weight: 165 lb 3.2 oz (74.9 kg)    Objective:   Physical  Exam Vitals and nursing note reviewed.  Constitutional:      General: She is not in acute distress. Neck:     Vascular: No JVD.  Cardiovascular:     Rate and Rhythm: Normal rate and regular rhythm.     Heart sounds: Normal heart sounds. No murmur heard.   Pulmonary:     Effort: Pulmonary effort is normal.     Breath sounds: Normal breath sounds. No wheezing or rales.  Musculoskeletal:     Right lower leg: No edema.     Left lower leg: No edema.         Assessment & Recommendations:   60 y/o [place] American female with eft arm and neck pain  No exertional component to primarily left sided neck and arm pain. Low suspicion for angina.  Continue treatment for likely musculoskeletal pain as per PCP. If no improvement in 4-6 weeks, or if she develops any exertional component to her symptoms, I will then recommend exercise treadmill stress test.  I will see her on as needed basis.  Thank you for referring the patient to [place]. Please feel free to contact with any questions.   Nigel Mormon, MD Pager: [phone] Office: [phone]

## 2021-12-19 ENCOUNTER — Ambulatory Visit: Payer: BC Managed Care – PPO | Admitting: Dermatology

## 2021-12-19 ENCOUNTER — Encounter: Payer: Self-pay | Admitting: Dermatology

## 2021-12-19 ENCOUNTER — Other Ambulatory Visit: Payer: Self-pay

## 2021-12-19 DIAGNOSIS — D1723 Benign lipomatous neoplasm of skin and subcutaneous tissue of right leg: Secondary | ICD-10-CM | POA: Diagnosis not present

## 2021-12-19 DIAGNOSIS — L815 Leukoderma, not elsewhere classified: Secondary | ICD-10-CM

## 2021-12-19 DIAGNOSIS — R238 Other skin changes: Secondary | ICD-10-CM | POA: Diagnosis not present

## 2021-12-19 DIAGNOSIS — D1730 Benign lipomatous neoplasm of skin and subcutaneous tissue of unspecified sites: Secondary | ICD-10-CM

## 2022-01-07 ENCOUNTER — Encounter: Payer: Self-pay | Admitting: Dermatology

## 2022-01-07 NOTE — Progress Notes (Signed)
° °  New Patient   Subjective  Tammy Whitney is a 61 y.o. female who presents for the following: New Patient (Initial Visit) (Lesions on right thigh x years, getting bigger over time. Lesion behind right knee x years.).  The patient, several areas of concern to her Location:  Duration:  Quality:  Associated Signs/Symptoms: Modifying Factors:  Severity:  Timing: Context:    The following portions of the chart were reviewed this encounter and updated as appropriate:  Tobacco   Allergies   Meds   Problems   Med Hx   Surg Hx   Fam Hx       Objective  Well appearing patient in no apparent distress; mood and affect are within normal limits. Left Lower Leg - Anterior, Right Lower Leg - Anterior Hypopigmented 4 mm macules  Right Popliteal Fossa Doughy 2 cm subcutaneous nontender nodule  Right Medial Thigh Doughy 2 cm nontender subcutaneous nodule  Right Plantar Surface of Heel Pain with subcutaneous 8 mm bulge    A focused examination was performed including head, neck, back, arms, legs, feet. Relevant physical exam findings are noted in the Assessment and Plan.   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

## 2022-01-31 ENCOUNTER — Encounter: Payer: Self-pay | Admitting: Dermatology

## 2022-01-31 ENCOUNTER — Encounter (INDEPENDENT_AMBULATORY_CARE_PROVIDER_SITE_OTHER): Payer: Self-pay

## 2022-01-31 ENCOUNTER — Ambulatory Visit (INDEPENDENT_AMBULATORY_CARE_PROVIDER_SITE_OTHER): Payer: BC Managed Care – PPO | Admitting: Dermatology

## 2022-01-31 ENCOUNTER — Other Ambulatory Visit: Payer: Self-pay

## 2022-01-31 DIAGNOSIS — D1720 Benign lipomatous neoplasm of skin and subcutaneous tissue of unspecified limb: Secondary | ICD-10-CM

## 2022-01-31 DIAGNOSIS — D1723 Benign lipomatous neoplasm of skin and subcutaneous tissue of right leg: Secondary | ICD-10-CM

## 2022-02-20 ENCOUNTER — Encounter: Payer: Self-pay | Admitting: Dermatology

## 2022-02-21 NOTE — Progress Notes (Signed)
? ?  Follow-Up Visit ?  ?Subjective  ?Tammy Whitney is a 61 y.o. female who presents for the following: Lipoma (Right inner groin). ? ?Cyst right groin ?Location:  ?Duration:  ?Quality:  ?Associated Signs/Symptoms: ?Modifying Factors:  ?Severity:  ?Timing: ?Context:  ? ?Objective  ?Well appearing patient in no apparent distress; mood and affect are within normal limits. ?Right Thigh - Anterior ?Doughy soft 2 cm moles compatible with nevus lipomatosis superficialis. ? ? ? ?A focused examination was performed including leg plus leg crease areas. Relevant physical exam findings are noted in the Assessment and Plan. ? ? ?Assessment & Plan  ? ? ?Lipoma of lower extremity, unspecified laterality ?Right Thigh - Anterior ? ?We again discussed the benign nature of these common fatty cysts and the fact that unless there are clinical change excision necessary selective.  Patient chose to leave the lesion rather than have it excised.  Follow-up can be on a as needed basis. ? ? ? ? ? ?I, Lavonna Monarch, MD, have reviewed all documentation for this visit.  The documentation on 02/21/22 for the exam, diagnosis, procedures, and orders are all accurate and complete. ?

## 2022-03-14 ENCOUNTER — Encounter: Payer: BC Managed Care – PPO | Admitting: Dermatology

## 2022-03-23 ENCOUNTER — Emergency Department (HOSPITAL_COMMUNITY): Payer: BC Managed Care – PPO

## 2022-03-23 ENCOUNTER — Emergency Department (HOSPITAL_COMMUNITY)
Admission: EM | Admit: 2022-03-23 | Discharge: 2022-03-23 | Disposition: A | Discharge status: Home / Self Care | Payer: BC Managed Care – PPO | Attending: Emergency Medicine | Admitting: Emergency Medicine

## 2022-03-23 DIAGNOSIS — R112 Nausea with vomiting, unspecified: Secondary | ICD-10-CM | POA: Insufficient documentation

## 2022-03-23 DIAGNOSIS — R1013 Epigastric pain: Secondary | ICD-10-CM | POA: Insufficient documentation

## 2022-03-23 DIAGNOSIS — R0789 Other chest pain: Secondary | ICD-10-CM | POA: Diagnosis present

## 2022-03-23 DIAGNOSIS — R079 Chest pain, unspecified: Secondary | ICD-10-CM

## 2022-03-23 LAB — URINALYSIS, ROUTINE W REFLEX MICROSCOPIC
Bacteria, UA: NONE SEEN
Bilirubin Urine: NEGATIVE
Glucose, UA: NEGATIVE mg/dL
Hgb urine dipstick: NEGATIVE
Ketones, ur: NEGATIVE mg/dL
Nitrite: NEGATIVE
Protein, ur: NEGATIVE mg/dL
Specific Gravity, Urine: 1.018 (ref 1.005–1.030)
pH: 6 (ref 5.0–8.0)

## 2022-03-23 LAB — CBC WITH DIFFERENTIAL/PLATELET
Abs Immature Granulocytes: 0.02 10*3/uL (ref 0.00–0.07)
Basophils Absolute: 0 10*3/uL (ref 0.0–0.1)
Basophils Relative: 0 %
Eosinophils Absolute: 0.1 10*3/uL (ref 0.0–0.5)
Eosinophils Relative: 1 %
HCT: 40 % (ref 36.0–46.0)
Hemoglobin: 12.5 g/dL (ref 12.0–15.0)
Immature Granulocytes: 0 %
Lymphocytes Relative: 15 %
Lymphs Abs: 0.8 10*3/uL (ref 0.7–4.0)
MCH: 26.8 pg (ref 26.0–34.0)
MCHC: 31.3 g/dL (ref 30.0–36.0)
MCV: 85.8 fL (ref 80.0–100.0)
Monocytes Absolute: 0.3 10*3/uL (ref 0.1–1.0)
Monocytes Relative: 6 %
Neutro Abs: 4.1 10*3/uL (ref 1.7–7.7)
Neutrophils Relative %: 78 %
Platelets: 152 10*3/uL (ref 150–400)
RBC: 4.66 MIL/uL (ref 3.87–5.11)
RDW: 13 % (ref 11.5–15.5)
WBC: 5.3 10*3/uL (ref 4.0–10.5)
nRBC: 0 % (ref 0.0–0.2)

## 2022-03-23 LAB — COMPREHENSIVE METABOLIC PANEL
ALT: 15 U/L (ref 0–44)
AST: 23 U/L (ref 15–41)
Albumin: 3.8 g/dL (ref 3.5–5.0)
Alkaline Phosphatase: 45 U/L (ref 38–126)
Anion gap: 9 (ref 5–15)
BUN: 14 mg/dL (ref 8–23)
CO2: 22 mmol/L (ref 22–32)
Calcium: 9.2 mg/dL (ref 8.9–10.3)
Chloride: 107 mmol/L (ref 98–111)
Creatinine, Ser: 0.83 mg/dL (ref 0.44–1.00)
GFR, Estimated: >60 mL/min (ref >60)
Glucose, Bld: 119 mg/dL — ABNORMAL HIGH (ref 70–99)
Potassium: 3.4 mmol/L — ABNORMAL LOW (ref 3.5–5.1)
Sodium: 138 mmol/L (ref 135–145)
Total Bilirubin: 0.7 mg/dL (ref 0.3–1.2)
Total Protein: 6.6 g/dL (ref 6.5–8.1)

## 2022-03-23 LAB — TROPONIN I (HIGH SENSITIVITY)
Troponin I (High Sensitivity): 2 ng/L (ref <18)
Troponin I (High Sensitivity): 3 ng/L (ref <18)

## 2022-03-23 LAB — LIPASE, BLOOD: Lipase: 27 U/L (ref 11–51)

## 2022-03-23 MED ORDER — ONDANSETRON HCL 4 MG/2ML IJ SOLN
4.0000 mg | Freq: Once | INTRAMUSCULAR | Status: AC
  Administered 2022-03-23: 4 mg via INTRAVENOUS
  Filled 2022-03-23: qty 2

## 2022-03-23 MED ORDER — ONDANSETRON 4 MG PO TBDP: ORAL_TABLET | ORAL | 0 refills | Status: DC

## 2022-03-23 MED ORDER — LACTATED RINGERS IV BOLUS
1000.0000 mL | Freq: Once | INTRAVENOUS | Status: AC
  Administered 2022-03-23: 1000 mL via INTRAVENOUS

## 2022-03-23 MED ORDER — ONDANSETRON 4 MG PO TBDP: ORAL_TABLET | ORAL | 0 refills | Status: AC

## 2022-03-23 NOTE — Discharge Instructions (Signed)
Follow-up closely with your primary doctor.  Use Zofran as needed for nausea and vomiting.  Return for new concerns. ?

## 2022-03-23 NOTE — ED Notes (Signed)
DC instructions reviewed with pt. Pt verbalized understanding.  PT DC.  

## 2022-03-23 NOTE — ED Provider Notes (Signed)
Patient care signed out to follow-up repeat troponin and discharge if negative.  Patient presents with GI like symptoms, no current chest pain.  Second troponin reviewed and negative.  Urinalysis reviewed no signs significant infection urine culture added on.  Patient stable for outpatient follow-up with Zofran as needed. ? ?[name] ? ?  ?[name], MD ?03/23/22 1851 ? ?

## 2022-03-23 NOTE — ED Provider Notes (Signed)
?[facility] ?Provider Note ? ? ?CSN: [id number] ?Arrival date & time: 03/23/22  1424 ? ?  ? ?History ? ?Chief Complaint  ?Patient presents with  ? Chest Pain  ? ? ?Tammy Whitney is a 61 y.o. female. ? ?HPI ? ?  ? ?61yo female with history of GERD presents with concern for nausea.  CODE STEMI was called by EMS but canceled by Cardiology on her arrival.  She does report some hx of prior left arm pain and chest tightness but no chest pain today. Denies any chest tightness, neck pain, back pain, arm or jaw pain today. No dyspnea today. No fevers. ? ?Today went for a walk for a few miles, felt good. Around noon, developed feeling woozy, nauseas.  No numbness, weakness, vision changes, speech problems.   She [place] and began to have stomach cramping like she was going to have diarrhea, and they pulled over.  She had a small BM but not diarrhea. [place] and had multiple episodes of emesis after getting out of the car.   ? ?EMS was at the scene and called code stemi. She reports feeling improved now.  ? ? ? ?Past Medical History:  ?Diagnosis Date  ? Acid reflux   ? Food allergy   ?  ? ?Home Medications ?Prior to Admission medications   ?Medication Sig Start Date End Date Taking? Authorizing Provider  ?ondansetron (ZOFRAN-ODT) 4 MG disintegrating tablet '4mg'$  ODT q4 hours prn nausea/vomit 03/23/22   [name], MD  ?pantoprazole (PROTONIX) 40 MG tablet Take 1 tablet by mouth daily. 03/26/21   [provider]  ?   ? ?Allergies    ?Fish allergy   ? ?Review of Systems   ?Review of Systems ? ?Physical Exam ?Updated Vital Signs ?BP (!) 114/58 (BP Location: Left Arm)   Pulse 77   Temp 97.9 ?F (36.6 ?C) (Oral)   Resp 18   SpO2 100%  ?Physical Exam ?Vitals and nursing note reviewed.  ?Constitutional:   ?   General: She is not in acute distress. ?   Appearance: She is well-developed. She is not diaphoretic.  ?HENT:   ?   Head: Normocephalic and atraumatic.  ?Eyes:  ?   Conjunctiva/sclera: Conjunctivae normal.  ?Cardiovascular:  ?   Rate and Rhythm: Normal rate and regular rhythm.  ?   Heart sounds: Normal heart sounds. No murmur heard. ?  No friction rub. No gallop.  ?Pulmonary:  ?   Effort: Pulmonary effort is normal. No respiratory distress.  ?   Breath sounds: Normal breath sounds. No wheezing or rales.  ?Abdominal:  ?   General: There is no distension.  ?   Palpations: Abdomen is soft.  ?   Tenderness: There is no abdominal tenderness. There is no guarding.  ?Musculoskeletal:     ?   General: No tenderness.  ?   Cervical back: Normal range of motion.  ?Skin: ?   General: Skin is warm and dry.  ?   Findings: No erythema or rash.  ?Neurological:  ?   Mental Status: She is alert and oriented to person, place, and time.  ? ? ?ED Results / Procedures / Treatments   ?Labs ?(all labs ordered are listed, but only abnormal results are displayed) ?Labs Reviewed  ?COMPREHENSIVE METABOLIC PANEL - Abnormal; Notable for the following components:  ?    Result Value  ? Potassium  3.4 (*)   ? Glucose, Bld 119 (*)   ? All other components within normal limits  ?URINALYSIS, ROUTINE W REFLEX MICROSCOPIC - Abnormal; Notable for the following components:  ? Leukocytes,Ua MODERATE (*)   ? All other components within normal limits  ?URINE CULTURE  ?CBC WITH DIFFERENTIAL/PLATELET  ?LIPASE, BLOOD  ?TROPONIN I (HIGH SENSITIVITY)  ?TROPONIN I (HIGH SENSITIVITY)  ? ? ?EKG ?EKG Interpretation ? ?Date/Time:  Saturday Mar 23 2022 14:28:51 EDT ?Ventricular Rate:  79 ?PR Interval:  170 ?QRS Duration: 92 ?QT Interval:  373 ?QTC Calculation: 428 ?R Axis:   64 ?Text Interpretation: Sinus rhythm Abnormal R-wave progression, early transition Mild STE, discussed with Dr. Burt Knack, slightly increased from prior Confirmed by Gareth Morgan [phone]) on 03/23/2022 3:00:41 PM ? ?Radiology ?DG Chest Portable 1 View ? ?Result Date: 03/23/2022 ?CLINICAL DATA:  Nausea EXAM:  PORTABLE CHEST 1 VIEW COMPARISON:  Chest x-ray 08/03/2018 FINDINGS: Heart size and mediastinal contours are within normal limits. No suspicious pulmonary opacities identified. No pleural effusion or pneumothorax visualized. No acute osseous abnormality appreciated. IMPRESSION: No acute intrathoracic process identified. Electronically Signed   By: Ofilia Neas M.D.   On: 03/23/2022 14:49   ? ?Procedures ?Procedures  ? ? ?Medications Ordered in ED ?Medications  ?lactated ringers bolus 1,000 mL (0 mLs Intravenous Stopped 03/23/22 1738)  ?ondansetron (ZOFRAN) injection 4 mg (4 mg Intravenous Given 03/23/22 1449)  ? ? ?ED Course/ Medical Decision Making/ A&P ?  ? ?61yo female with history of GERD presents with concern for nausea.  ? ?CODE STEMI called by EMS< canceled by Cardiology on arrival.  ? ?No focal neurologic symptoms to suggest CVA.  Labs completed and interpreted by me show no significant anemia or electrolyte abnormalities, normal lipase.  Troponin negative and have low suspicion for ACS given no CP today, however given nausea and symptoms beginning at noon will check delta troponin.  Abdominal exam benign. Possible early viral gastroenteritis given nausea, feeling of abdominal cramping.  Given IV fluids, zofran and feels improved.  ? ?Signed out with troponin and UA pending. If negative, plan on discharge with PCP follow up. Patient discharged in stable condition with understanding of reasons to return.  ? ? ? ? ? ? ? ?Final Clinical Impression(s) / ED Diagnoses ?Final diagnoses:  ?Acute chest pain  ?Epigastric discomfort  ? ? ?Rx / DC Orders ?ED Discharge Orders   ? ?      Ordered  ?  ondansetron (ZOFRAN-ODT) 4 MG disintegrating tablet  Status:  Discontinued       ? 03/23/22 1841  ?  ondansetron (ZOFRAN-ODT) 4 MG disintegrating tablet       ? 03/23/22 1849  ? ?  ?  ? ?  ? ? ?  ?Gareth Morgan, MD ?03/23/22 2239 ? ?

## 2022-03-23 NOTE — ED Triage Notes (Signed)
Pt has experienced some intermittent Chest 'tightness" that she describes as "belchy" after meals. None of that today.  She has experienced a little dizzyness yesterday and today.  This afternoon she experienced some stomach cramping and nausea and the need for a bowel momvement on way to a graduation event.  She stopped on way and had a solid BM.  By the time she arrived to graduation she threw up in the parking lot. The medic on scene performed an ECG with minimal elevation  in lead 2 but a STEMI was called as a precaution.  The Stemi has been cancelled at this time.  ? ?EMS VS WNL, Pt is A&O.  Pt received 324 of ASA with EMs.  ?

## 2022-03-25 LAB — URINE CULTURE: Special Requests: NORMAL

## 2022-05-02 ENCOUNTER — Ambulatory Visit (INDEPENDENT_AMBULATORY_CARE_PROVIDER_SITE_OTHER): Payer: BC Managed Care – PPO | Admitting: Dermatology

## 2022-05-02 DIAGNOSIS — D1723 Benign lipomatous neoplasm of skin and subcutaneous tissue of right leg: Secondary | ICD-10-CM

## 2022-05-02 DIAGNOSIS — D485 Neoplasm of uncertain behavior of skin: Secondary | ICD-10-CM

## 2022-05-16 ENCOUNTER — Ambulatory Visit (INDEPENDENT_AMBULATORY_CARE_PROVIDER_SITE_OTHER): Payer: BC Managed Care – PPO

## 2022-05-16 DIAGNOSIS — Z4802 Encounter for removal of sutures: Secondary | ICD-10-CM

## 2022-05-16 NOTE — Progress Notes (Signed)
No signs or symptoms of infection and path to patient

## 2022-05-20 ENCOUNTER — Encounter: Payer: Self-pay | Admitting: Dermatology

## 2022-05-20 NOTE — Progress Notes (Signed)
   Follow-Up Visit   Subjective  Tammy Whitney is a 61 y.o. female who presents for the following: Procedure (Patient here today for lipoma removal ).  2 growths on right upper leg which are bothersome to patient.  She requests removal. Location:  Duration:  Quality:  Associated Signs/Symptoms: Modifying Factors:  Severity:  Timing: Context:   Objective  Well appearing patient in no apparent distress; mood and affect are within normal limits. Right Medial Thigh, right post knee Doughy subcutaneous nodules are compatible with lipomas/nevus lipomatosis.  Patient requests removal.  Told preoperatively risks of recurrence, unsightly scar, wound infection.  As she requested we proceed.         A focused examination was performed including leg.. Relevant physical exam findings are noted in the Assessment and Plan.   Assessment & Plan    Benign lipomatous neoplasm of skin and subcutaneous tissue of right leg (2) right post knee; Right Medial Thigh  Skin excision - right post knee  Lesion length (cm):  2.5 Lesion width (cm):  2 Margin per side (cm):  0 Total excision diameter (cm):  2.5 Anesthesia: the lesion was anesthetized in a standard fashion   Anesthetic:  1% lidocaine w/ epinephrine 1-100,000 local infiltration Instrument used: #15 blade   Outcome: patient tolerated procedure well with no complications   Post-procedure details: sterile dressing applied and wound care instructions given   Dressing type: pressure dressing and petrolatum    Skin excision - Right Medial Thigh  Lesion length (cm):  2 Lesion width (cm):  1 Margin per side (cm):  0 Total excision diameter (cm):  2 Anesthesia: the lesion was anesthetized in a standard fashion   Anesthetic:  1% lidocaine w/ epinephrine 1-100,000 local infiltration Instrument used: #15 blade   Hemostasis achieved with: suture and pressure   Outcome: patient tolerated procedure well with no complications    Post-procedure details: wound care instructions given    Specimen 1 - Surgical pathology Differential Diagnosis: lipoma  Check Margins: No  Specimen 2 - Surgical pathology Differential Diagnosis: lipoma  Check Margins: No      I, Lavonna Monarch, MD, have reviewed all documentation for this visit.  The documentation on 05/20/22 for the exam, diagnosis, procedures, and orders are all accurate and complete.

## 2022-05-23 ENCOUNTER — Encounter: Payer: BC Managed Care – PPO | Admitting: Dermatology

## 2022-12-05 ENCOUNTER — Other Ambulatory Visit: Payer: Self-pay | Admitting: Family Medicine

## 2022-12-05 DIAGNOSIS — M818 Other osteoporosis without current pathological fracture: Secondary | ICD-10-CM

## 2022-12-24 ENCOUNTER — Ambulatory Visit
Admission: RE | Admit: 2022-12-24 | Discharge: 2022-12-24 | Disposition: A | Discharge status: Home / Self Care | Payer: BC Managed Care – PPO | Source: Ambulatory Visit | Attending: Family Medicine | Admitting: Family Medicine

## 2022-12-24 DIAGNOSIS — M818 Other osteoporosis without current pathological fracture: Secondary | ICD-10-CM

## 2025-01-03 ENCOUNTER — Ambulatory Visit: Admitting: Physician Assistant
